# Patient Record
Sex: Male | Born: 2000 | Hispanic: Yes | Marital: Single | Smoking: Former smoker
Health system: Southern US, Community
[De-identification: ages and names within clinical notes are randomized; demographics above are authoritative.]

## PROBLEM LIST (undated history)

## (undated) DIAGNOSIS — Z68.41 Body mass index (BMI) pediatric, 85th percentile to less than 95th percentile for age: Secondary | ICD-10-CM

## (undated) DIAGNOSIS — G8929 Other chronic pain: Secondary | ICD-10-CM

## (undated) DIAGNOSIS — R062 Wheezing: Principal | ICD-10-CM

## (undated) DIAGNOSIS — H1010 Acute atopic conjunctivitis, unspecified eye: Secondary | ICD-10-CM

## (undated) DIAGNOSIS — R51 Headache: Secondary | ICD-10-CM

## (undated) DIAGNOSIS — T7840XA Allergy, unspecified, initial encounter: Secondary | ICD-10-CM

## (undated) HISTORY — DX: Acute atopic conjunctivitis, unspecified eye: H10.10

## (undated) HISTORY — DX: Headache: R51

## (undated) HISTORY — DX: Body mass index (bmi) pediatric, 85th percentile to less than 95th percentile for age: Z68.53

## (undated) HISTORY — DX: Other chronic pain: G89.29

## (undated) HISTORY — DX: Wheezing: R06.2

## (undated) HISTORY — DX: Allergy, unspecified, initial encounter: T78.40XA

---
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
## 2020-03-07 IMAGING — CR DG CHEST 2V
2 series · 2 of 2 positions shown · non-contrast
Comparison: 09/21/2013

CLINICAL DATA: Positive PPD

EXAM:
CHEST - 2 VIEW

[w chest pa]
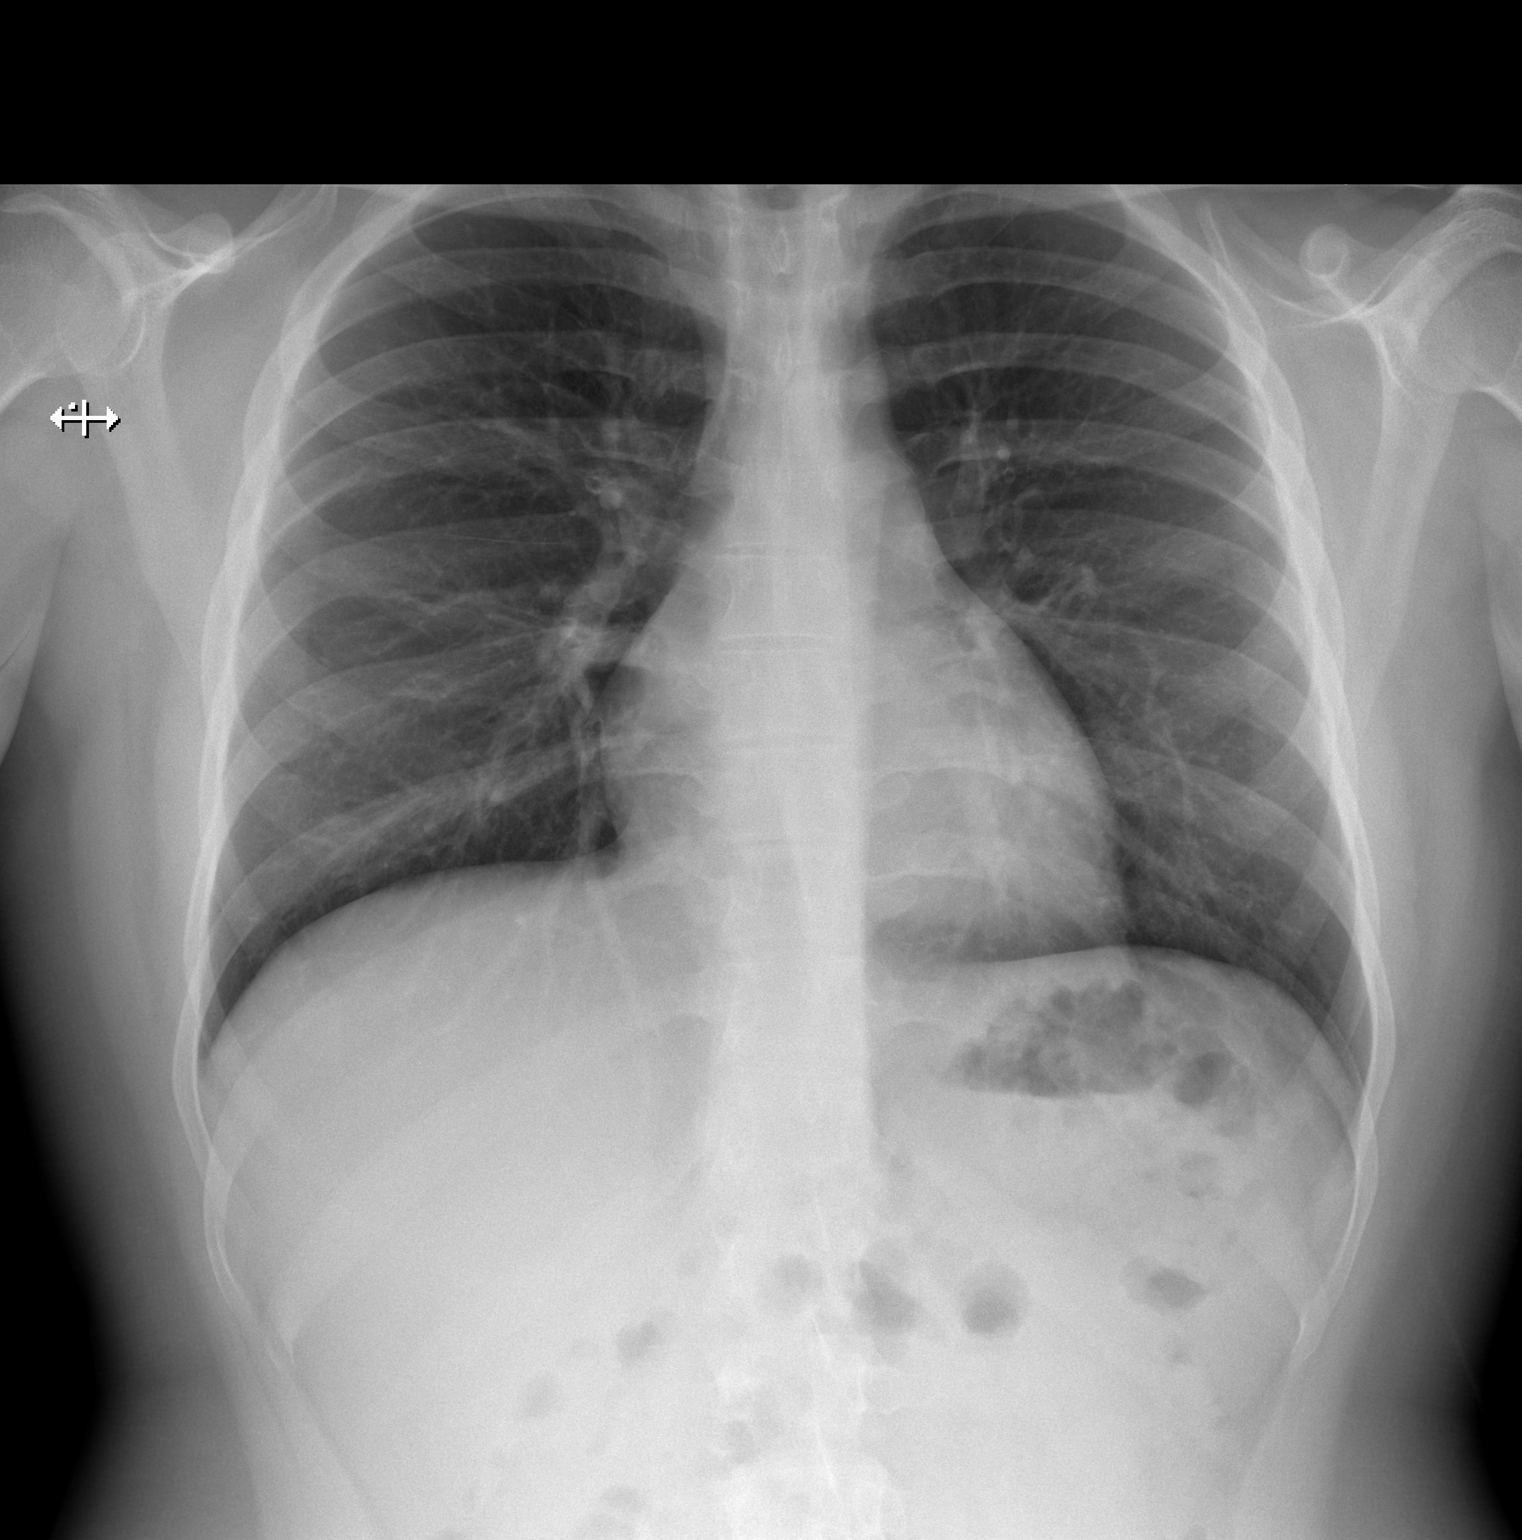

[w chest lat]
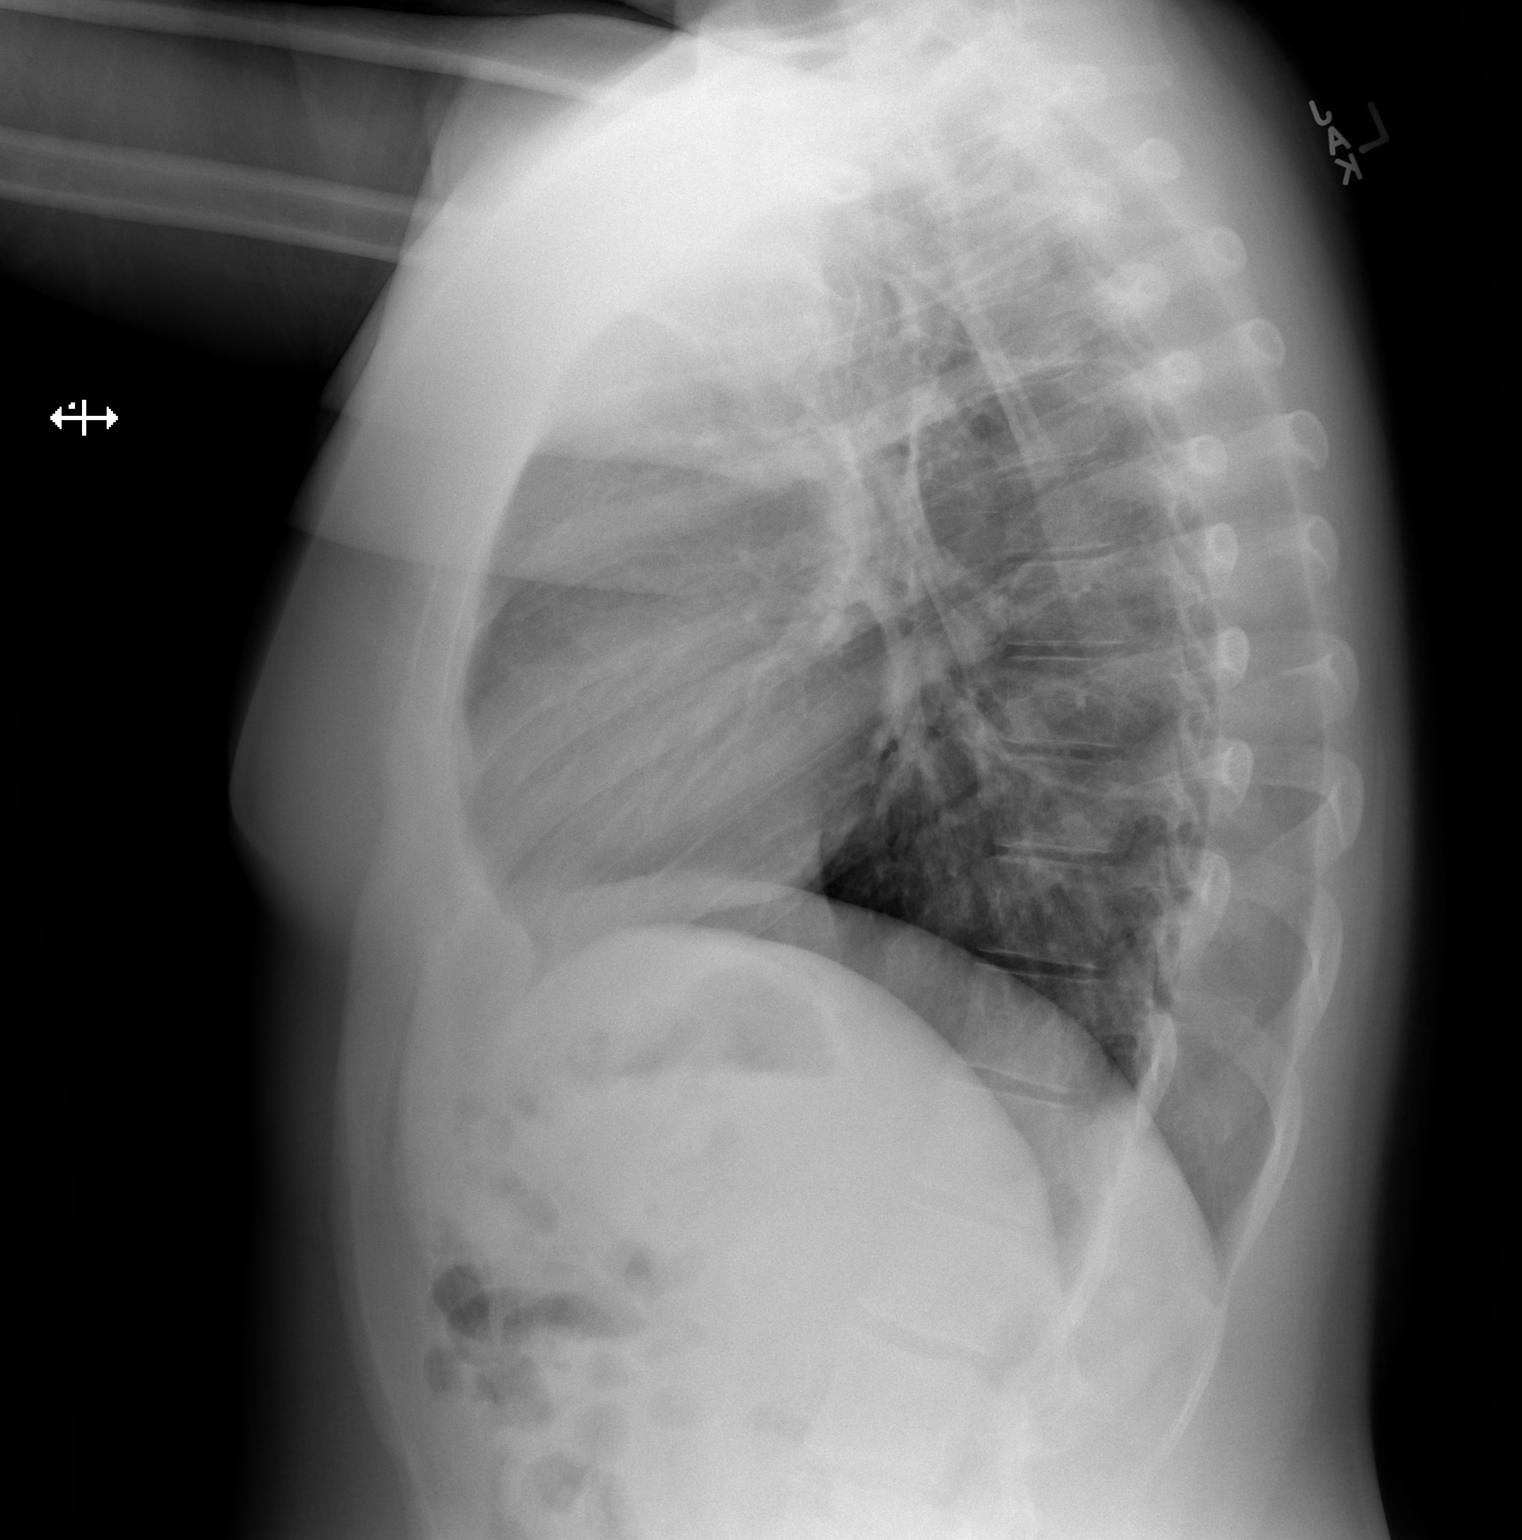

[2 of 2 positions shown; findings below may reference images not displayed]

FINDINGS: The heart size and mediastinal contours are within normal limits.
Both lungs are clear. The visualized skeletal structures are
unremarkable.
IMPRESSION: No active cardiopulmonary disease.

## 2022-12-15 ENCOUNTER — Ambulatory Visit: Payer: Self-pay

## 2022-12-15 ENCOUNTER — Other Ambulatory Visit: Payer: Self-pay | Admitting: Nurse Practitioner

## 2022-12-15 DIAGNOSIS — M546 Pain in thoracic spine: Secondary | ICD-10-CM

## 2022-12-15 DIAGNOSIS — M542 Cervicalgia: Secondary | ICD-10-CM

## 2022-12-15 DIAGNOSIS — M25512 Pain in left shoulder: Secondary | ICD-10-CM

## 2024-09-30 ENCOUNTER — Emergency Department (HOSPITAL_COMMUNITY)

## 2024-09-30 ENCOUNTER — Encounter (HOSPITAL_COMMUNITY): Payer: Self-pay | Admitting: *Deleted

## 2024-09-30 ENCOUNTER — Other Ambulatory Visit: Payer: Self-pay

## 2024-09-30 ENCOUNTER — Emergency Department (HOSPITAL_COMMUNITY)
Admission: EM | Admit: 2024-09-30 | Discharge: 2024-09-30 | Disposition: A | Discharge status: Home / Self Care | Source: Home / Self Care | Attending: Emergency Medicine | Admitting: Emergency Medicine

## 2024-09-30 DIAGNOSIS — Z87891 Personal history of nicotine dependence: Secondary | ICD-10-CM | POA: Diagnosis not present

## 2024-09-30 DIAGNOSIS — R059 Cough, unspecified: Secondary | ICD-10-CM | POA: Diagnosis present

## 2024-09-30 DIAGNOSIS — D72829 Elevated white blood cell count, unspecified: Secondary | ICD-10-CM | POA: Diagnosis not present

## 2024-09-30 DIAGNOSIS — J4541 Moderate persistent asthma with (acute) exacerbation: Secondary | ICD-10-CM | POA: Diagnosis not present

## 2024-09-30 LAB — CBC WITH DIFFERENTIAL/PLATELET
Abs Immature Granulocytes: 0.14 K/uL — ABNORMAL HIGH (ref 0.00–0.07)
Basophils Absolute: 0.1 K/uL (ref 0.0–0.1)
Basophils Relative: 1 %
Eosinophils Absolute: 1.3 K/uL — ABNORMAL HIGH (ref 0.0–0.5)
Eosinophils Relative: 7 %
HCT: 52.7 % — ABNORMAL HIGH (ref 39.0–52.0)
Hemoglobin: 17.8 g/dL — ABNORMAL HIGH (ref 13.0–17.0)
Immature Granulocytes: 1 %
Lymphocytes Relative: 18 %
Lymphs Abs: 3.5 K/uL (ref 0.7–4.0)
MCH: 29.6 pg (ref 26.0–34.0)
MCHC: 33.8 g/dL (ref 30.0–36.0)
MCV: 87.7 fL (ref 80.0–100.0)
Monocytes Absolute: 1.3 K/uL — ABNORMAL HIGH (ref 0.1–1.0)
Monocytes Relative: 7 %
Neutro Abs: 13 K/uL — ABNORMAL HIGH (ref 1.7–7.7)
Neutrophils Relative %: 66 %
Platelets: 379 K/uL (ref 150–400)
RBC: 6.01 MIL/uL — ABNORMAL HIGH (ref 4.22–5.81)
RDW: 12.5 % (ref 11.5–15.5)
WBC: 19.2 K/uL — ABNORMAL HIGH (ref 4.0–10.5)
nRBC: 0 % (ref 0.0–0.2)

## 2024-09-30 LAB — I-STAT CHEM 8, ED
BUN: 10 mg/dL (ref 6–20)
Calcium, Ion: 1.13 mmol/L — ABNORMAL LOW (ref 1.15–1.40)
Chloride: 104 mmol/L (ref 98–111)
Creatinine, Ser: 0.7 mg/dL (ref 0.61–1.24)
Glucose, Bld: 90 mg/dL (ref 70–99)
HCT: 49 % (ref 39.0–52.0)
Hemoglobin: 16.7 g/dL (ref 13.0–17.0)
Potassium: 3.6 mmol/L (ref 3.5–5.1)
Sodium: 140 mmol/L (ref 135–145)
TCO2: 22 mmol/L (ref 22–32)

## 2024-09-30 LAB — RESP PANEL BY RT-PCR (RSV, FLU A&B, COVID)  RVPGX2
Influenza A by PCR: NEGATIVE
Influenza B by PCR: NEGATIVE
Resp Syncytial Virus by PCR: NEGATIVE
SARS Coronavirus 2 by RT PCR: NEGATIVE

## 2024-09-30 MED ORDER — IPRATROPIUM-ALBUTEROL 0.5-2.5 (3) MG/3ML IN SOLN
3.0000 mL | RESPIRATORY_TRACT | Status: AC
  Administered 2024-09-30 (×2): 3 mL via RESPIRATORY_TRACT

## 2024-09-30 MED ORDER — LACTATED RINGERS IV BOLUS: 1000.0000 mL | Freq: Once | INTRAVENOUS | Status: DC

## 2024-09-30 MED ORDER — LACTATED RINGERS IV BOLUS
1000.0000 mL | Freq: Once | INTRAVENOUS | Status: AC
  Administered 2024-09-30: 1000 mL via INTRAVENOUS

## 2024-09-30 MED ORDER — ALBUTEROL SULFATE HFA 108 (90 BASE) MCG/ACT IN AERS: 1.0000 | INHALATION_SPRAY | Freq: Four times a day (QID) | RESPIRATORY_TRACT | 2 refills | Status: AC | PRN

## 2024-09-30 MED ORDER — IPRATROPIUM-ALBUTEROL 0.5-2.5 (3) MG/3ML IN SOLN
RESPIRATORY_TRACT | Status: AC
  Administered 2024-09-30: 3 mL
  Filled 2024-09-30: qty 9

## 2024-09-30 MED ORDER — ALBUTEROL SULFATE (2.5 MG/3ML) 0.083% IN NEBU
INHALATION_SOLUTION | RESPIRATORY_TRACT | Status: AC
  Filled 2024-09-30: qty 24

## 2024-09-30 MED ORDER — MOMETASONE FURO-FORMOTEROL FUM 100-5 MCG/ACT IN AERO: 2.0000 | INHALATION_SPRAY | Freq: Two times a day (BID) | RESPIRATORY_TRACT | 1 refills | Status: AC

## 2024-09-30 MED ORDER — METHYLPREDNISOLONE SODIUM SUCC 40 MG IJ SOLR
40.0000 mg | Freq: Once | INTRAMUSCULAR | Status: AC
  Administered 2024-09-30: 40 mg via INTRAVENOUS
  Filled 2024-09-30: qty 1

## 2024-09-30 MED ORDER — ALBUTEROL (5 MG/ML) CONTINUOUS INHALATION SOLN
20.0000 mg/h | INHALATION_SOLUTION | Freq: Once | RESPIRATORY_TRACT | Status: DC
  Filled 2024-09-30: qty 20

## 2024-09-30 MED ORDER — MAGNESIUM SULFATE 2 GM/50ML IV SOLN
2.0000 g | Freq: Once | INTRAVENOUS | Status: AC
  Administered 2024-09-30: 2 g via INTRAVENOUS
  Filled 2024-09-30: qty 50

## 2024-09-30 MED ORDER — PREDNISONE 10 MG PO TABS: 40.0000 mg | ORAL_TABLET | Freq: Every day | ORAL | 0 refills | Status: AC

## 2024-09-30 NOTE — Discharge Instructions (Addendum)
 You were seen today for shortness of breath. While you were here we monitored your vitals, preformed a physical exam, and labs and chest x-ray. These were all reassuring and there is no indication for any further testing or intervention in the emergency department at this time.   Things to do:  - Follow up with your primary care provider within the next 1-2 weeks  - Use your Dulera inhaler with 2 puffs in the morning and 2 puffs in the evening, wash your mouth out after using your Dulera inhaler to prevent thrush  -We prescribed you 4 more days of steroids, please take them  - Use your albuterol  inhaler with 1 to 2 puffs every 6 hours as needed for wheezing.  If you feel like you are using your albuterol  inhaler and you are not getting better you need to call 911 or come to the emergency department  -Please follow-up with pulmonology, I have sent a referral in the be reaching out to you  Return to the emergency department if you have any new or worsening symptoms including worsening shortness of breath, wheezing at home, difficulty with breathing does not getting better with your inhalers, or if you have any other concerns.

## 2024-09-30 NOTE — ED Provider Notes (Signed)
 " [facility] Provider Note   HPI/ROS    History obtained from patient and EMR.  Rodney King is a 23 y.o. male who presents for SOB / Cough and who  has a past medical history of Allergic conjunctivitis (06/21/2013), Allergy, Body mass index, pediatric, 85th percentile to less than 95th percentile for age (06/21/2013), Chronic headaches, and Wheezing (09/26/2013).  Patient presents today for shortness of breath that started this morning.  Has had a productive cough today as well.  States that over the last year he has had several episodes of this, but is never come to the emergency department.  States that he will intermittently have a feeling of abdominal bloating, and then start feeling short of breath again.  Denies any history of asthma, but does state that he was wheezy as a kid and has had this problem since he was young.  Denies any fevers, chills, chest pain, nausea, vomiting, or diarrhea.  No new exposures to any medications or foods.  No itchy rash.  MDM   I have reviewed the nursing documentation, vital signs, as well as the past medical history, surgical history, family history, and social history.  Initial Assessment:  Patient tachycardic and tachypneic on initial evaluation with minimal air movement bilaterally with faint end expiratory wheezing.  Denies any known history of asthma, but has documented history of wheezing in the chart and has prior prescriptions for albuterol  inhaler, which she says he has never gotten.  No signs of anaphylaxis aside from wheezing, no urticaria, hypotension, or emesis.  Will give DuoNebs, Medrol , LR.  Also get a chest x-ray and EKG.  As well as basic labs and COVID and flu.  Interpretation of the EKG is sinus tachycardia without obvious ischemia, dysrhythmia, or high-grade AV block.  Patient looking improved after some DuoNebs, but still wheezing so we will go to continuous albuterol .  Will also give the  patient magnesium  and an additional liter of LR.  CBC here with leukocytosis with left shift, and lieu of no recent sick symptoms feel this is likely margination in the setting of respiratory distress.  BMP with no significant electrolyte abnormality or AKI.  Patient looking significantly better with improved tachypnea and breath sounds bilaterally after additional continuous albuterol  and magnesium .  Had prolonged discussion with the patient that he likely has asthma despite never remembering being formally diagnosed with it.  Will send patient pulmonology referral for PFTs.  Also had prolonged discussion with the patient regarding medications am sending him home with.  To send the patient home with Dulera, albuterol , and prednisone .  We discussed using the Dulera every day and that he needs to wash his mouth out to prevent thrush.  Patient is going to follow-up with his PCP as soon as he is able to and plans follow-up with pulmonology in the outpatient setting.  Patient discharged home in hemodynamically stable condition.  He was given strict return precautions.  Disposition:  I discussed the plan for discharge with the patient and/or their surrogate at bedside prior to discharge and they were in agreement with the plan and verbalized understanding of the return precautions provided. All questions answered to the best of my ability. Ultimately, the patient was discharged in stable condition with stable vital signs. I am reassured that they are capable of close follow up and good social support at home.    This patient was staffed with Dr. Patt who supervised the visit and agreed with  the plan of care.  Due to the patients current presenting symptoms, physical exam findings, and the workup stated above, it is thought that the etiology of the patients current presentation is:  1. Moderate persistent asthma with exacerbation    Clinical Complexity A medically appropriate history, review of systems,  and physical exam was performed.  Factors that affect the complexity of this encounter: assessment of correct protocol, laboratory work from this visit, notes from other physicians (pediatrics notes), and review of echocardiogram/EKG results  My independent interpretations of diagnostic studies are documented in the ED course above.   If decision rules were used in this patient's evaluation, they are listed below.   Click here for ABCD2, HEART and other calculators  Patient's presentation is most consistent with acute presentation with potential threat to life or bodily function.  MDM generated using voice dictation software and may contain dictation errors. Please contact me for any clarification or with any questions.    Physical Exam, PMH, PSH, Family History, and Social Hsitory   Vitals:   09/30/24 2130 09/30/24 2200 09/30/24 2240 09/30/24 2308  BP: 121/69 (!) 130/106  (!) 128/56  Pulse: (!) 102 (!) 119 (!) 102 (!) 105  Resp: 14 20 10 15   Temp:      SpO2: 100% 100% 100% 100%  Weight:      Height:        Physical Exam Constitutional:      Appearance: Normal appearance.  HENT:     Head: Normocephalic and atraumatic.     Mouth/Throat:     Mouth: Mucous membranes are moist.     Pharynx: Oropharynx is clear.  Cardiovascular:     Rate and Rhythm: Regular rhythm. Tachycardia present.  Pulmonary:     Effort: Tachypnea present.     Breath sounds: Decreased breath sounds and wheezing present.     Comments: Decreased air movement bilaterally with scattered end expiratory wheezing Abdominal:     General: Abdomen is flat. There is no distension.     Palpations: Abdomen is soft.     Tenderness: There is no abdominal tenderness. There is no guarding.  Musculoskeletal:        General: Normal range of motion.  Skin:    General: Skin is warm and dry.     Capillary Refill: Capillary refill takes less than 2 seconds.  Neurological:     General: No focal deficit present.     Mental  Status: He is alert and oriented to person, place, and time.     Past Medical History:  Diagnosis Date   Allergic conjunctivitis 06/21/2013   Allergy    Body mass index, pediatric, 85th percentile to less than 95th percentile for age 33/06/2013   Chronic headaches    Seen by opthalmology and vision is normal   Wheezing 09/26/2013     Social History   Tobacco Use   Smoking status: Former    Types: Cigarettes    Passive exposure: Past   Smokeless tobacco: Never  Substance Use Topics   Alcohol use: Yes     Procedures   If procedures were preformed on this patient, they are listed below:  Procedures   Electronically signed by:   Glendia Carlin Ancona, M.D. PGY-2, Emergency Medicine   Please note that this documentation was produced with the assistance of voice-to-text technology and may contain errors.    Ancona Glendia, MD 09/30/24 [phone]  "

## 2024-09-30 NOTE — ED Triage Notes (Signed)
 Patient reports SOB with dry cough and chest congestion onset this morning .

## 2024-09-30 NOTE — ED Notes (Signed)
 The pt has audible wheezes and labored breathing

## 2024-10-04 ENCOUNTER — Ambulatory Visit (INDEPENDENT_AMBULATORY_CARE_PROVIDER_SITE_OTHER): Admitting: Pulmonary Disease

## 2024-10-04 ENCOUNTER — Encounter: Payer: Self-pay | Admitting: Pulmonary Disease

## 2024-10-04 VITALS — BP 127/81 | HR 77 | Ht 69.0 in | Wt 179.0 lb

## 2024-10-04 DIAGNOSIS — J45909 Unspecified asthma, uncomplicated: Secondary | ICD-10-CM

## 2024-10-04 DIAGNOSIS — Z87891 Personal history of nicotine dependence: Secondary | ICD-10-CM | POA: Diagnosis not present

## 2024-10-04 DIAGNOSIS — R062 Wheezing: Secondary | ICD-10-CM

## 2024-10-04 DIAGNOSIS — Z79899 Other long term (current) drug therapy: Secondary | ICD-10-CM

## 2024-10-04 DIAGNOSIS — Z7951 Long term (current) use of inhaled steroids: Secondary | ICD-10-CM | POA: Diagnosis not present

## 2024-10-04 DIAGNOSIS — J4541 Moderate persistent asthma with (acute) exacerbation: Secondary | ICD-10-CM

## 2024-10-04 NOTE — Progress Notes (Signed)
 "              Rodney King    [id number]    2000-11-19  Primary Care Physician:Pcp, No  Referring Physician: Patt Alm Macho, MD [address] [place],  [place] [id number]  Chief complaint:   Patient being seen with concern for asthma  Discussed the use of AI scribe software for clinical note transcription with the patient, who gave verbal consent to proceed.  History of Present Illness Rodney King is a 23 year old male with newly diagnosed asthma who presents with a recent exacerbation.  He experienced a significant asthma exacerbation on September 30, 2024, characterized by severe shortness of breath, a sensation of 'breathing through a straw', and near syncope. This episode began the night before and persisted throughout the day, leading him to seek hospital care by 7 PM. He had not been hospitalized for shortness of breath prior to this event.  He recalls having similar episodes in the past but did not recognize them as asthma-related. Since childhood, he has experienced breathing difficulties, including mouth breathing, wheezing, and coughing, which he considered normal. He has not noticed any specific situations that exacerbate his symptoms and has not paid attention to possible triggers. He has a dog but does not believe it triggers his asthma.  He was not using any inhalers prior to the recent diagnosis. Currently, he has been prescribed a rescue inhaler (albuterol ) and a maintenance inhaler, as well as prednisone  for short-term inflammation control.  He works in an environment with potential respiratory irritants, such as mold and fire-related materials, and acknowledges the need for protective measures like wearing a mask. He is transitioning to a position with less exposure to these irritants. He has a history of smoking but has quit.   Does not have an underlying diagnosis of asthma Has always had issues with bronchitis, wheezing but no definite label of  asthma  Works at [employer] -Does have some concerning exposures at work  Past history of smoking  Outpatient Encounter Medications as of 10/04/2024  Medication Sig   albuterol  (PROVENTIL  HFA) 108 (90 BASE) MCG/ACT inhaler Inhale 2 puffs into the lungs every 6 (six) hours as needed for wheezing or shortness of breath. Reported on 12/26/2015   albuterol  (VENTOLIN  HFA) 108 (90 Base) MCG/ACT inhaler Inhale 1-2 puffs into the lungs every 6 (six) hours as needed for wheezing or shortness of breath.   azelastine  (ASTELIN ) 0.1 % nasal spray Inhale one spray into each nostril twice a day to treat congestion and runny nose   ibuprofen  (ADVIL ) 600 MG tablet Take one tablet every 6 hours as needed for headache   mometasone -formoterol  (DULERA) 100-5 MCG/ACT AERO Inhale 2 puffs into the lungs in the morning and at bedtime.   predniSONE  (DELTASONE ) 10 MG tablet Take 4 tablets (40 mg total) by mouth daily for 4 days.   No facility-administered encounter medications on file as of 10/04/2024.    Allergies as of 10/04/2024   (No Known Allergies)    Past Medical History:  Diagnosis Date   Allergic conjunctivitis 06/21/2013   Allergy    Body mass index, pediatric, 85th percentile to less than 95th percentile for age 85/06/2013   Chronic headaches    Seen by opthalmology and vision is normal   Wheezing 09/26/2013    No past surgical history on file.  No family history on file.  Social History   Socioeconomic History   Marital status: Single    Spouse  name: Not on file   Number of children: Not on file   Years of education: Not on file   Highest education level: Not on file  Occupational History   Not on file  Tobacco Use   Smoking status: Former    Types: Cigarettes    Passive exposure: Past   Smokeless tobacco: Never  Substance and Sexual Activity   Alcohol use: Yes   Drug use: Never   Sexual activity: Not on file  Other Topics Concern   Not on file  Social History Narrative   Lives  with parents and older sister.  Moved here from [place] about 1 1/2 years ago.  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  Middle.  English is still very limited.   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     Vitals:   10/04/24 1303  BP: 127/81  Pulse: 77  SpO2: 95%     Physical Exam Constitutional:      Appearance: Normal appearance.  HENT:     Head: Normocephalic.     Nose: Nose normal.     Mouth/Throat:     Mouth: Mucous membranes are moist.  Eyes:     General: No scleral icterus. Cardiovascular:     Rate and Rhythm: Normal rate.     Pulses: Normal pulses.     Heart sounds: No murmur heard.    No friction rub.  Pulmonary:     Effort: No respiratory distress.     Breath sounds: No stridor. No wheezing or rhonchi.  Musculoskeletal:     Cervical back: Normal range of motion.  Skin:    General: Skin is warm.  Neurological:     General: No focal deficit present.     Mental Status: He is alert.  Psychiatric:        Mood and Affect: Mood normal.    Data Reviewed: ED visit 09/30/2024 reviewed  Chest x-ray 09/30/2024 reviewed by myself showing no infiltrative process    Assessment and Plan Assessment & Plan Asthma Recent exacerbation on December 19th, 2025, with symptoms of dyspnea and near syncope. Long-standing wheezing and dyspnea since childhood,  likely undiagnosed asthma. No family history of asthma. No known triggers identified, but potential environmental triggers discussed. Recent exacerbation possibly triggered by environmental exposure. No current use of inhalers prior to recent hospitalization. Current treatment includes prednisone  and inhalers. Discussed the role of prednisone  in reducing inflammation and the use of rescue and maintenance inhalers. Emphasized the importance of identifying and avoiding triggers, and the potential for asthma to be triggered by environmental factors such as mold and stress. Discussed the importance of using a mask in occupational settings to prevent exacerbations. Explained that asthma symptoms can be triggered by environmental factors, stress, and infections, and that the use  of albuterol  can cause jitteriness and increased heart rate if overused. - Continue prednisone  as prescribed, tapering over 5-7 days. - Use rescue inhaler (albuterol ) as needed for acute symptoms, up to four times a day. - Use maintenance inhaler (Dulera) two puffs twice a day - Avoid known asthma triggers, including environmental allergens and irritants. - Use a mask in occupational settings to prevent exposure to mold and other irritants. - Monitor symptoms and adjust inhaler use based on symptom control. - Scheduled follow-up appointment in three months to assess asthma control.  Inhaler technique was reviewed and does demonstrate good inhaler technique  Follow-up in about 3 months if symptoms are well-controlled  He may make a decision to hold Dulera if symptoms remain well-controlled and not having significant exacerbation  No reason to get a PFT at present if he continues to do well  Encouraged to give us  a call with any significant concerns   No orders of the defined types were placed in this encounter.     Jennet Epley MD  Pulmonary and Critical Care 10/04/2024, 1:32 PM  CC: Patt Alm Macho,  MD   "

## 2024-10-04 NOTE — Patient Instructions (Signed)
 I will see you back in about 3 months  Make sure you have albuterol  available all the time to use 2 puffs up to 4 times a day as needed You may go days and weeks without needing it  Dulera is 2 puffs twice a day regardless of how you are feeling, make sure you rinse your mouth afterwards You can decrease the dose as tolerated if you feel your symptoms are much better controlled  Call us  with significant concerns  I do not believe we need to do a breathing study at present  Try and pay attention to things that you feel trigger your symptoms and avoid them as possible

## 2025-01-16 ENCOUNTER — Ambulatory Visit: Admitting: Pulmonary Disease
